# Patient Record
Sex: Male | Born: 2000 | Race: Black or African American | Hispanic: No | Marital: Single | Smoking: Never smoker
Health system: Southern US, Community
[De-identification: ages and names within clinical notes are randomized; demographics above are authoritative.]

## PROBLEM LIST (undated history)

## (undated) DIAGNOSIS — K5090 Crohn's disease, unspecified, without complications: Secondary | ICD-10-CM

---
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
## 2021-02-27 ENCOUNTER — Other Ambulatory Visit (HOSPITAL_COMMUNITY): Payer: Self-pay

## 2021-02-27 MED ORDER — CETIRIZINE HCL 10 MG PO TABS
10.0000 mg | ORAL_TABLET | Freq: Every day | ORAL | 0 refills | Status: AC
  Filled 2021-02-27: qty 30, 30d supply, fill #0

## 2021-02-27 MED ORDER — PREDNISONE 10 MG PO TABS: ORAL_TABLET | ORAL | 0 refills | Status: AC

## 2021-02-27 MED ORDER — SULFAMETHOXAZOLE-TRIMETHOPRIM 800-160 MG PO TABS
1.0000 | ORAL_TABLET | Freq: Two times a day (BID) | ORAL | 0 refills | Status: AC
  Filled 2021-02-27: qty 20, 10d supply, fill #0
